# Patient Record
Sex: Male | Born: 1990 | Race: White | Hispanic: No | Marital: Single | Smoking: Never smoker
Health system: Southern US, Community
[De-identification: ages and names within clinical notes are randomized; demographics above are authoritative.]

---
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
## 2009-08-14 ENCOUNTER — Emergency Department (HOSPITAL_COMMUNITY): Admission: EM | Admit: 2009-08-14 | Discharge: 2009-08-14 | Payer: Self-pay | Admitting: Emergency Medicine

## 2010-01-21 ENCOUNTER — Emergency Department (HOSPITAL_COMMUNITY)
Admission: EM | Admit: 2010-01-21 | Discharge: 2010-01-21 | Payer: Self-pay | Source: Home / Self Care | Admitting: Emergency Medicine

## 2010-01-23 LAB — BASIC METABOLIC PANEL
BUN: 9 mg/dL (ref 6–23)
CO2: 29 mEq/L (ref 19–32)
Calcium: 10.3 mg/dL (ref 8.4–10.5)
Chloride: 101 mEq/L (ref 96–112)
Creatinine, Ser: 1.13 mg/dL (ref 0.4–1.5)
GFR calc Af Amer: >60 mL/min (ref >60)
GFR calc non Af Amer: >60 mL/min (ref >60)
Glucose, Bld: 99 mg/dL (ref 70–99)
Potassium: 4.1 mEq/L (ref 3.5–5.1)
Sodium: 141 mEq/L (ref 135–145)

## 2010-01-23 LAB — DIFFERENTIAL
Basophils Absolute: 0 10*3/uL (ref 0.0–0.1)
Basophils Relative: 0 % (ref 0–1)
Eosinophils Absolute: 0 10*3/uL (ref 0.0–0.7)
Eosinophils Relative: 0 % (ref 0–5)
Lymphocytes Relative: 9 % — ABNORMAL LOW (ref 12–46)
Lymphs Abs: 1.2 10*3/uL (ref 0.7–4.0)
Monocytes Absolute: 0.8 10*3/uL (ref 0.1–1.0)
Monocytes Relative: 6 % (ref 3–12)
Neutro Abs: 10.8 10*3/uL — ABNORMAL HIGH (ref 1.7–7.7)
Neutrophils Relative %: 85 % — ABNORMAL HIGH (ref 43–77)

## 2010-01-23 LAB — CBC
HCT: 46.9 % (ref 39.0–52.0)
Hemoglobin: 15.9 g/dL (ref 13.0–17.0)
MCH: 29.3 pg (ref 26.0–34.0)
MCHC: 33.9 g/dL (ref 30.0–36.0)
MCV: 86.4 fL (ref 78.0–100.0)
Platelets: 202 10*3/uL (ref 150–400)
RBC: 5.43 MIL/uL (ref 4.22–5.81)
RDW: 13 % (ref 11.5–15.5)
WBC: 12.7 10*3/uL — ABNORMAL HIGH (ref 4.0–10.5)

## 2010-01-23 LAB — ETHANOL: Alcohol, Ethyl (B): <5 mg/dL (ref 0–10)

## 2010-01-23 LAB — RAPID URINE DRUG SCREEN, HOSP PERFORMED
Amphetamines: NOT DETECTED
Barbiturates: NOT DETECTED
Benzodiazepines: NOT DETECTED
Cocaine: NOT DETECTED
Opiates: NOT DETECTED
Tetrahydrocannabinol: NOT DETECTED

## 2010-01-30 ENCOUNTER — Emergency Department (HOSPITAL_COMMUNITY)
Admission: EM | Admit: 2010-01-30 | Discharge: 2010-01-30 | Payer: Self-pay | Source: Home / Self Care | Admitting: Emergency Medicine

## 2010-01-31 ENCOUNTER — Inpatient Hospital Stay (HOSPITAL_COMMUNITY)
Admission: AD | Admit: 2010-01-31 | Discharge: 2010-02-07 | Disposition: A | Discharge status: Home / Self Care | Payer: Self-pay | Attending: Psychiatry | Admitting: Psychiatry

## 2010-01-31 LAB — BASIC METABOLIC PANEL
BUN: 11 mg/dL (ref 6–23)
CO2: 30 mEq/L (ref 19–32)
Calcium: 9.9 mg/dL (ref 8.4–10.5)
Chloride: 99 mEq/L (ref 96–112)
Creatinine, Ser: 1.17 mg/dL (ref 0.4–1.5)
GFR calc Af Amer: >60 mL/min (ref >60)
GFR calc non Af Amer: >60 mL/min (ref >60)
Glucose, Bld: 91 mg/dL (ref 70–99)
Potassium: 3.4 mEq/L — ABNORMAL LOW (ref 3.5–5.1)
Sodium: 140 mEq/L (ref 135–145)

## 2010-01-31 LAB — DIFFERENTIAL
Basophils Absolute: 0 10*3/uL (ref 0.0–0.1)
Basophils Relative: 0 % (ref 0–1)
Eosinophils Absolute: 0.1 10*3/uL (ref 0.0–0.7)
Eosinophils Relative: 0 % (ref 0–5)
Lymphocytes Relative: 9 % — ABNORMAL LOW (ref 12–46)
Lymphs Abs: 1.3 10*3/uL (ref 0.7–4.0)
Monocytes Absolute: 1.1 10*3/uL — ABNORMAL HIGH (ref 0.1–1.0)
Monocytes Relative: 8 % (ref 3–12)
Neutro Abs: 11.8 10*3/uL — ABNORMAL HIGH (ref 1.7–7.7)
Neutrophils Relative %: 82 % — ABNORMAL HIGH (ref 43–77)

## 2010-01-31 LAB — URINALYSIS, ROUTINE W REFLEX MICROSCOPIC
Bilirubin Urine: NEGATIVE
Hgb urine dipstick: NEGATIVE
Ketones, ur: NEGATIVE mg/dL
Nitrite: NEGATIVE
Protein, ur: NEGATIVE mg/dL
Specific Gravity, Urine: >1.03 — ABNORMAL HIGH (ref 1.005–1.030)
Urine Glucose, Fasting: NEGATIVE mg/dL
Urobilinogen, UA: 0.2 mg/dL (ref 0.0–1.0)
pH: 5.5 (ref 5.0–8.0)

## 2010-01-31 LAB — RAPID URINE DRUG SCREEN, HOSP PERFORMED
Amphetamines: NOT DETECTED
Barbiturates: NOT DETECTED
Benzodiazepines: NOT DETECTED
Cocaine: NOT DETECTED
Opiates: NOT DETECTED
Tetrahydrocannabinol: NOT DETECTED

## 2010-01-31 LAB — CBC
HCT: 43.1 % (ref 39.0–52.0)
Hemoglobin: 15.3 g/dL (ref 13.0–17.0)
MCH: 30.9 pg (ref 26.0–34.0)
MCHC: 35.5 g/dL (ref 30.0–36.0)
MCV: 87.1 fL (ref 78.0–100.0)
Platelets: 199 10*3/uL (ref 150–400)
RBC: 4.95 MIL/uL (ref 4.22–5.81)
RDW: 12.9 % (ref 11.5–15.5)
WBC: 14.4 10*3/uL — ABNORMAL HIGH (ref 4.0–10.5)

## 2010-01-31 LAB — ETHANOL: Alcohol, Ethyl (B): <5 mg/dL (ref 0–10)

## 2010-02-07 NOTE — H&P (Signed)
NAME:  Juan Castro, Juan Castro NO.:  [id number]  MEDICAL RECORD NO.:  [id number]          PATIENT TYPE:  IPS  LOCATION:  0507                          FACILITY:  [facility]  PHYSICIAN:  Marlis Edelson, DO        DATE OF BIRTH:  04/21/1990  DATE OF ADMISSION:  01/31/2010 DATE OF DISCHARGE:                      PSYCHIATRIC ADMISSION ASSESSMENT   This is a 20 year old single male, involuntary petitioned on January 31, 2010.  HISTORY OF PRESENT ILLNESS:  The patient is here on petition with papers stating that the patient has thoughts of suicide, had tied a noose in the woods and left a suicide note for his grandmother.  The patient does state that this is true.  He did write a suicide letter, left it on the counter for her to find, writing in the note that he would be behind the house.  They had seen a noose and a knife.  The patient was not nearby. He states that he was nervous when he had seen the noose and was concerned that he might actually harm himself and started walking, stating that he was "getting to that point."  Grandmother, again, had called the police, and the patient was taken to the emergency room for further assessment.  The patient reports a history of, depression having problems with his girlfriend.  He has significant legal issues.  He states he currently has warrants out for destroying property, trespassing, currently on probation for assault and stalking and communicating threats.  He denies any homicidality ideation towards her. He states he would never harm his girlfriend.  His sleep and appetite have been satisfactory.  He denies any substance use.  The patient also has a charge related to stealing his grandfather's gun that he states he had taken to his mother's house for her protection, and the patient states that his grandfather intends to drop the charge.  PAST PSYCHIATRIC HISTORY:  First admission to the [facility].  Was hospitalized in  [place] on April 2011 for depression and a history of suicide attempt which he would not elaborate on.  He states he was prescribed an antidepressant but never took the medication and is unclear what it was.  SOCIAL HISTORY:  A 20 year old single male who lives with his grandmother.  He lives in [place].  He obtained his GED.  Currently unemployed.  He has no children.  FAMILY HISTORY:  None.  ALCOHOL AND DRUG HISTORY:  He denies any alcohol substance use.  PRIMARY CARE PROVIDER:  None.  MEDICAL PROBLEMS:  The patient considers himself healthy.  MEDICATIONS:  None.  DRUG ALLERGIES:  None.  PHYSICAL EXAMINATION:  Physical exam was done at [facility]. This is a young, normally-developed male.  He appears well nourished, well developed.  He offers no physical complaints.  LABORATORY STUDIES:  His WBC count is elevated 14.4.  Urine drug screen is negative.  Urinalysis is negative.  Potassium of 3.4.  Alcohol level less than 5.  MENTAL STATUS EXAM:  The patient is fully alert and cooperative, casually dressed, fair eye contact.  Speech is clear.  Normal pace and tone.  Mood is depressed.  The patient does appear flat.  Thought processes are coherent, goal directed.  Does not appear to be actively responding to any internal stimuli.  Promises safety.  Anxious about leaving.  Cognitive function intact.  His memory appears intact. Judgment and insight are partial.  DIAGNOSES:  AXIS I:  Mood disorder, not otherwise specified. AXIS II:  Deferred. AXIS III:  No known medical conditions. AXIS IV:  Problems with primary support group, problems related to legal system, and other psychosocial problems. AXIS V:  Current is 30.  PLAN:  We will assess further comorbidities.  The patient may benefit from an antidepressant or mood stabilizer.  We will continue to assess his mood and affect.  Contact the family for concerns and safety issues. His tentative length of stay at  this time is 3 to 4 days.     Landry Corporal, N.P.   ______________________________ Marlis Edelson, DO    JO/MEDQ  D:  02/01/2010  T:  02/01/2010  Job:  [id number]  Electronically Signed by Limmie PatriciaP. on 02/06/2010 03:58:48 PM Electronically Signed by Marlis Edelson MD on 02/07/2010 07:58:01 PM

## 2010-02-07 NOTE — Discharge Summary (Signed)
NAME:  Juan Castro NO.:  [id number]  MEDICAL RECORD NO.:  [id number]          PATIENT TYPE:  IPS  LOCATION:  0507                          FACILITY:  [facility]  PHYSICIAN:  Juan Edelson, DO        DATE OF BIRTH:  [birth year]  DATE OF ADMISSION:  01/31/2010 DATE OF DISCHARGE:  02/07/2010                              DISCHARGE SUMMARY   CHIEF COMPLAINT:  Involuntary admission for suicidal ideation.  HISTORY OF CHIEF COMPLAINT:  Juan Castro is a 20 year old Caucasian male who was admitted to [facility] on January 31, 2010, via involuntary petition.  He was here on papers because of thoughts of suicide.  He had tied a noose in the woods near his home and left a suicidal note for his grandmother.  He relates that the precipitating cause for this was a relationship issue in which he broke up with a girlfriend.  The patient stated that he did write the note and he did tie the noose.  He later did not think about committing suicide, but began to walk away and was walking around thinking when he was picked up.  Fortunately his grandmother had called the police and the patient was taken to the emergency department for further assessment. He reports a history of depression and having problems with his girlfriend.  He has also had a history of significant legal issues and he currently has warrants out for destroying property, trespassing, and has been on probation for assault, stalking, and communicating threats. Again, he states that this all centers around activities, including the legal activities that stem from his problems with his girlfriend.  He denied any homicidal ideation towards her, however.  He stated he would never want to hurt her.  His sleep and appetite have been satisfactory. He denied any significant substance abuse.  He also stated that he had a charge related to stealing his grandfather's gun and that he had taken it from his mother's  house for protection.  He stated that his grandfather was going to drop the charge, which would help free him from that issue.  PAST PSYCHIATRIC HISTORY:  This was his first admission to [facility].  He had been hospitalized to [facility] in April of 2011 for depression and a history of suicidal attempt.  He states that he was prescribed an antidepressant, but never took the medication and he was uncertain as to what that medication was.  HOSPITAL COURSE:  During his course of hospitalization, Juan Castro was integrated into the adult unit.  Communications were kept open with his mother and family, particularly on suicide prevention and crisis intervention.  We also talked with them about removing weapons from the home prior to his discharge.  I had the opportunity to speak with his mother by telephone.  She felt that he was doing much better and that he was safe for discharge.  He would be living with his mother after his discharge.  It was also important to note that his mother has had a history of depression.  There has been a family history of depression. She has responded well  to Zoloft in the past and he was started on Zoloft at 50 mg daily.  He initially had some diaphoresis with the medicine and a bit of GI upset, but after several days the GI symptoms resolved, he had no further diaphoresis, no dry mouth, no headache, or other problems with the Zoloft.  He developed no suicidal ideation on the drug and in fact, his suicidal ideation resolved during his course of treatment.  Although initially reserved, he became more pleasant, cooperative, and engaging during the course of treatment.  By the time of discharge, he stated that he felt much better.  He was engaging well with peers and was also engaging with staff.  He was far more appropriate.  He interacted well on the unit.  He displayed no behavioral discord and manifested no evidence of hypomania, mania,  or psychosis.  He had no racing thoughts or hyperpsychomotor activity or agitation.  He related by the time of discharge that he had a positive safety plan.  He understood a lot of his recent problems were his issues over his girlfriend.  He had come to some resolutions about that and stated that "I need to remove myself in from that situation because of the difficulty that is causing me."  He understood that the problems of anger, aggression, and the suicidal thoughts stemmed from this relationship which has been unhealthy for him and he has intent to move on from that relationship.  His family also felt that his suicidal threats and recent activities have stemmed from this relationship issues.  Urine drug screen at admission was unremarkable.  He had had no history of using antidepressant medications, but he was willing to try the medication.  He did have a family history as related above of depression, with his mother responding well to Zoloft.  He had a paternal cousin who suffered from depression and suicide.  He stated that sometimes he does isolate himself when he feels poorly.  He had increased knowledge and insight about depressive symptoms during his course of care.  He remained active in the milieu.  By February 05, 2010, he was stating that all was good for him.  He was sleeping well, was eating better.  He was reporting no problems.  He was attending all groups and was making good progress.  He was engaging well with peers and staff.  His eye contact was good.  His speech was clear.  He remained alert and oriented.  One medical issue during the course of his hospitalization was an elevated blood pressure 145/99 at the time of admission.  Further blood pressure showed some elevations.  He did not interact at this point with treatment for antidepressants, but I have recommended that he take his blood pressure at home and at a local clinic, the [facility].  He is  also to follow up with primary care.  There has a family history of hypertension, with his father and grandparents suffering from elevated blood pressures.  His highest blood pressure noted during the course of hospitalization was 152/105 and that was early morning on the date of discharge.  LABORATORY AND IMAGING:  None.  CONSULTATIONS:  None.  COMPLICATIONS:  None.  PROCEDURES:  None.  MENTAL STATUS EXAMINATION ON DISCHARGE:  He was casually dressed.  He was pleasant, cooperative, established good eye contact.  His motor function was normal.  His speech was clear, coherent.  Regular rate, rhythm, volume, and tone.  His level of conscious was  alert.  His mood was good.  He related that he was ready to go home.  He wanted to go home and do something fun after having been cooped up in the hospital for the past week.  He stated "my mood is hyper."  I am anxious about getting home.  He did not display hypomania or mania and did not relate any racing thoughts.  He simply said that he wanted to be active because of having been in the hospital for this period of time.  His anxiety level was diminished.  Thought process was linear, logical, and goal- directed.  Thought content without perceptual symptoms, ideas of reference, or delusions.  He relates no paranoia.  When asked about suicide, he states "no, no, no suicidal thoughts."  "I have too much to live for" and "I am too young."  No homicidal ideation.  Judgment had improved.  Insight had improved.  He was cognitively intact.  DISCHARGE DIAGNOSES:  Axis I:  Mood disorder, not otherwise specified. Axis II:  Deferred. Axis III:  Elevated blood pressures. Axis IV:  Problems with legal system, recent relationship discord, supportive mother. Axis V: 55.  CONDITION ON DISCHARGE:  Improved with resolution of suicidal ideation. He is now future directed.  He has no hypomania, no mania, no psychosis.  DISCHARGE INSTRUCTIONS:  He is to  taking his blood pressures at home with the machine that he has and also through the [facility].  I have recommended followup, given the family history of elevated blood pressures and the elevated blood pressures noted during the course of his hospitalization.  DISCHARGE MEDICATIONS:  Zoloft 50 mg p.o. daily for depression.  [facility] Wednesday, February 08, 2010, at 8 a.m. in [place], [place].  SPECIAL INSTRUCTIONS:  Return to the hospital for any adverse reactions to medications, marked change in mood and affect, or development of suicidal or homicidal ideation.  Prognosis is fair with appropriate psychiatric followup and compliance.          ______________________________ Juan Edelson, DO     DB/MEDQ  D:  02/07/2010  T:  02/07/2010  Job:  [id number]  Electronically Signed by Juan Edelson MD on 02/07/2010 07:58:07 PM

## 2010-03-24 LAB — URINALYSIS, ROUTINE W REFLEX MICROSCOPIC
Bilirubin Urine: NEGATIVE
Glucose, UA: NEGATIVE mg/dL
Hgb urine dipstick: NEGATIVE
Ketones, ur: NEGATIVE mg/dL
Nitrite: NEGATIVE
Protein, ur: NEGATIVE mg/dL
Specific Gravity, Urine: 1.025 (ref 1.005–1.030)
Urobilinogen, UA: 0.2 mg/dL (ref 0.0–1.0)
pH: 6 (ref 5.0–8.0)

## 2011-01-03 ENCOUNTER — Emergency Department (HOSPITAL_COMMUNITY): Admission: EM | Admit: 2011-01-03 | Discharge: 2011-01-03 | Discharge status: Against Medical Advice | Payer: Self-pay

## 2011-01-03 NOTE — ED Notes (Signed)
No answer when called 

## 2011-07-22 ENCOUNTER — Encounter (HOSPITAL_COMMUNITY): Payer: Self-pay

## 2011-07-22 ENCOUNTER — Emergency Department (HOSPITAL_COMMUNITY): Payer: Self-pay

## 2011-07-22 ENCOUNTER — Emergency Department (HOSPITAL_COMMUNITY)
Admission: EM | Admit: 2011-07-22 | Discharge: 2011-07-22 | Disposition: A | Discharge status: Home / Self Care | Payer: Self-pay | Attending: Emergency Medicine | Admitting: Emergency Medicine

## 2011-07-22 DIAGNOSIS — S61409A Unspecified open wound of unspecified hand, initial encounter: Secondary | ICD-10-CM | POA: Insufficient documentation

## 2011-07-22 DIAGNOSIS — S61412A Laceration without foreign body of left hand, initial encounter: Secondary | ICD-10-CM

## 2011-07-22 DIAGNOSIS — W298XXA Contact with other powered powered hand tools and household machinery, initial encounter: Secondary | ICD-10-CM | POA: Insufficient documentation

## 2011-07-22 DIAGNOSIS — M79609 Pain in unspecified limb: Secondary | ICD-10-CM | POA: Insufficient documentation

## 2011-07-22 MED ORDER — LIDOCAINE HCL (PF) 2 % IJ SOLN: 10.0000 mL | Freq: Once | INTRAMUSCULAR | Status: DC

## 2011-07-22 MED ORDER — TETANUS-DIPHTH-ACELL PERTUSSIS 5-2.5-18.5 LF-MCG/0.5 IM SUSP
0.5000 mL | Freq: Once | INTRAMUSCULAR | Status: AC
  Administered 2011-07-22: 0.5 mL via INTRAMUSCULAR
  Filled 2011-07-22: qty 0.5

## 2011-07-22 MED ORDER — LIDOCAINE HCL (PF) 2 % IJ SOLN
INTRAMUSCULAR | Status: DC
Start: 2011-07-22 — End: 2011-07-22
  Filled 2011-07-22: qty 10

## 2011-07-22 NOTE — ED Notes (Signed)
Was using metal grinder and cut left hand between 4th anf 5th finger

## 2011-07-22 NOTE — ED Notes (Signed)
Patient with no complaints at this time. Respirations even and unlabored. Skin warm/dry. Discharge instructions reviewed with patient at this time. Patient given opportunity to voice concerns/ask questions. Patient discharged at this time and left Emergency Department with steady gait.   

## 2011-07-22 NOTE — ED Provider Notes (Signed)
History     CSN: [id number]  Arrival date & time 07/22/11  1612   First MD Initiated Contact with Patient 07/22/11 1631      Chief Complaint  Patient presents with  . Laceration    (Consider location/radiation/quality/duration/timing/severity/associated sxs/prior treatment) Patient is a 21 y.o. male presenting with skin laceration. The history is provided by the patient.  Laceration  The incident occurred 3 to 5 hours ago. The laceration is located on the left hand. The laceration is 2 cm in size. The laceration mechanism was a a metal edge (lacaeration while working with a grinder.). The pain is moderate. The pain has been constant since onset. It is unknown if a foreign body is present. His tetanus status is out of date.    History reviewed. No pertinent past medical history.  History reviewed. No pertinent past surgical history.  No family history on file.  History  Substance Use Topics  . Smoking status: Never Smoker   . Smokeless tobacco: Not on file  . Alcohol Use: Yes     occ      Review of Systems  Constitutional: Negative for activity change.       All ROS Neg except as noted in HPI  HENT: Negative for nosebleeds and neck pain.   Eyes: Negative for photophobia and discharge.  Respiratory: Negative for cough, shortness of breath and wheezing.   Cardiovascular: Negative for chest pain and palpitations.  Gastrointestinal: Negative for abdominal pain and blood in stool.  Genitourinary: Negative for dysuria, frequency and hematuria.  Musculoskeletal: Negative for back pain and arthralgias.  Skin: Negative.   Neurological: Negative for dizziness, seizures and speech difficulty.  Psychiatric/Behavioral: Negative for hallucinations and confusion.    Allergies  Review of patient's allergies indicates no known allergies.  Home Medications  No current outpatient prescriptions on file.  BP 154/98  Pulse 109  Temp 98.7 F (37.1 C) (Oral)  Resp 20  Ht 6\' 4"   (1.93 m)  Wt 240 lb (108.863 kg)  BMI 29.21 kg/m2  SpO2 100%  Physical Exam  Nursing note and vitals reviewed. Constitutional: He is oriented to person, place, and time. He appears well-developed and well-nourished.  Non-toxic appearance.  HENT:  Head: Normocephalic.  Right Ear: Tympanic membrane and external ear normal.  Left Ear: Tympanic membrane and external ear normal.  Eyes: EOM and lids are normal. Pupils are equal, round, and reactive to light.  Neck: Normal range of motion. Neck supple. Carotid bruit is not present.  Cardiovascular: Normal rate, regular rhythm, normal heart sounds, intact distal pulses and normal pulses.   Pulmonary/Chest: Breath sounds normal. No respiratory distress.  Abdominal: Soft. Bowel sounds are normal. There is no tenderness. There is no guarding.  Musculoskeletal: Normal range of motion.       Laceration of the web space between the 4th and 5th  Fingers of the left hand. FROM of the fingers. Sensory and motor function wnl. No bone or tendon involvement.  Lymphadenopathy:       Head (right side): No submandibular adenopathy present.       Head (left side): No submandibular adenopathy present.    He has no cervical adenopathy.  Neurological: He is alert and oriented to person, place, and time. He has normal strength. No cranial nerve deficit or sensory deficit.  Skin: Skin is warm and dry.  Psychiatric: He has a normal mood and affect. His speech is normal.    ED Course  Procedures: LACERATION REPAIR - patient  identified by arm band. Permission for the procedure given by the patient. Time out taken before laceration repair of the left pain. The wound was painted with Betadine. It was infiltrated with 2% plain lidocaine. The wound was scrubbed with a Betadine scrub brush and irrigated with saline. The wound was then inspected there was no bone or tendon or capsule involvement. The wound was repaired with 6 interrupted sutures of 4-0 Prolene. The wound  measures 2.3 cm.  After the procedure there is good capillary refill and good movement of the fingers. Sterile dressing was applied. The patient tolerated the procedure without any problem or complication.  Labs Reviewed - No data to display No results found.   No diagnosis found.    MDM  Patient sustained a laceration to the left hand while working with a metal grinder. X-ray of the hand is negative for fracture or foreign body. The wound to the hand was repaired with 6 sutures. Patient to have the sutures removed in 7 days. Patient advised to return sooner if any signs of infection. Patient's tetanus status was updated today.        Kathie Dike, [place] 07/22/11 1740

## 2011-07-23 NOTE — ED Provider Notes (Signed)
Medical screening examination/treatment/procedure(s) were performed by non-physician practitioner and as supervising physician I was immediately available for consultation/collaboration. Devoria Albe, MD, FACEP   Ward Givens, MD 07/23/11 [phone]

## 2013-09-23 IMAGING — CR DG HAND COMPLETE 3+V*L*
4 series · 4 of 4 positions shown · non-contrast
Comparison: 07/16/2004 radiographs.

CLINICAL DATA: Laceration between the fourth and fifth fingers.

LEFT HAND - COMPLETE 3+ VIEW

[view not recorded (1 of 4)]
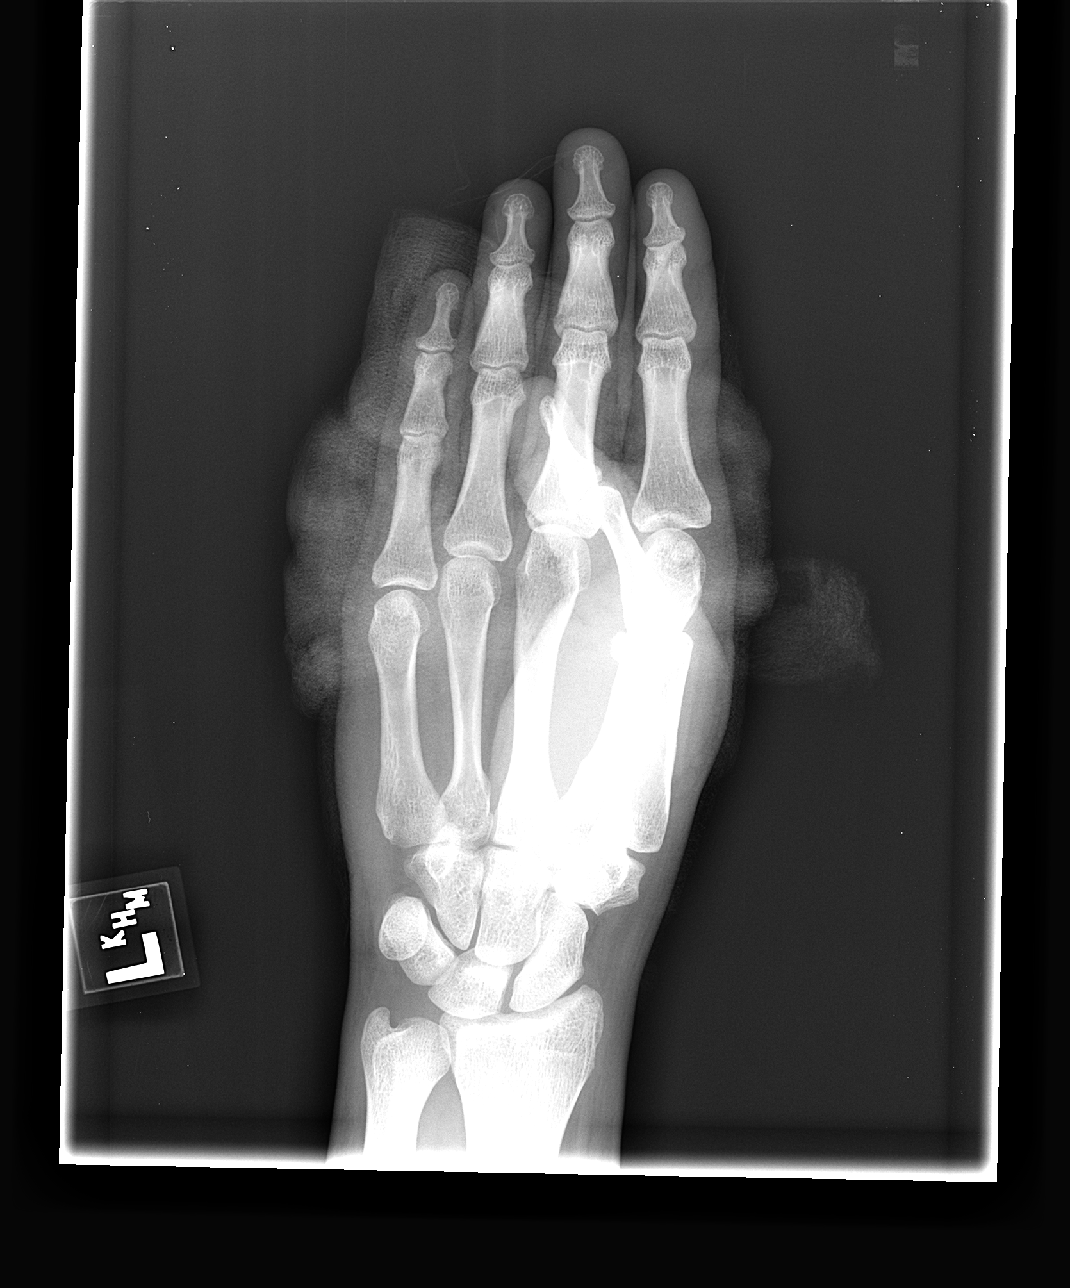

[view not recorded (2 of 4)]
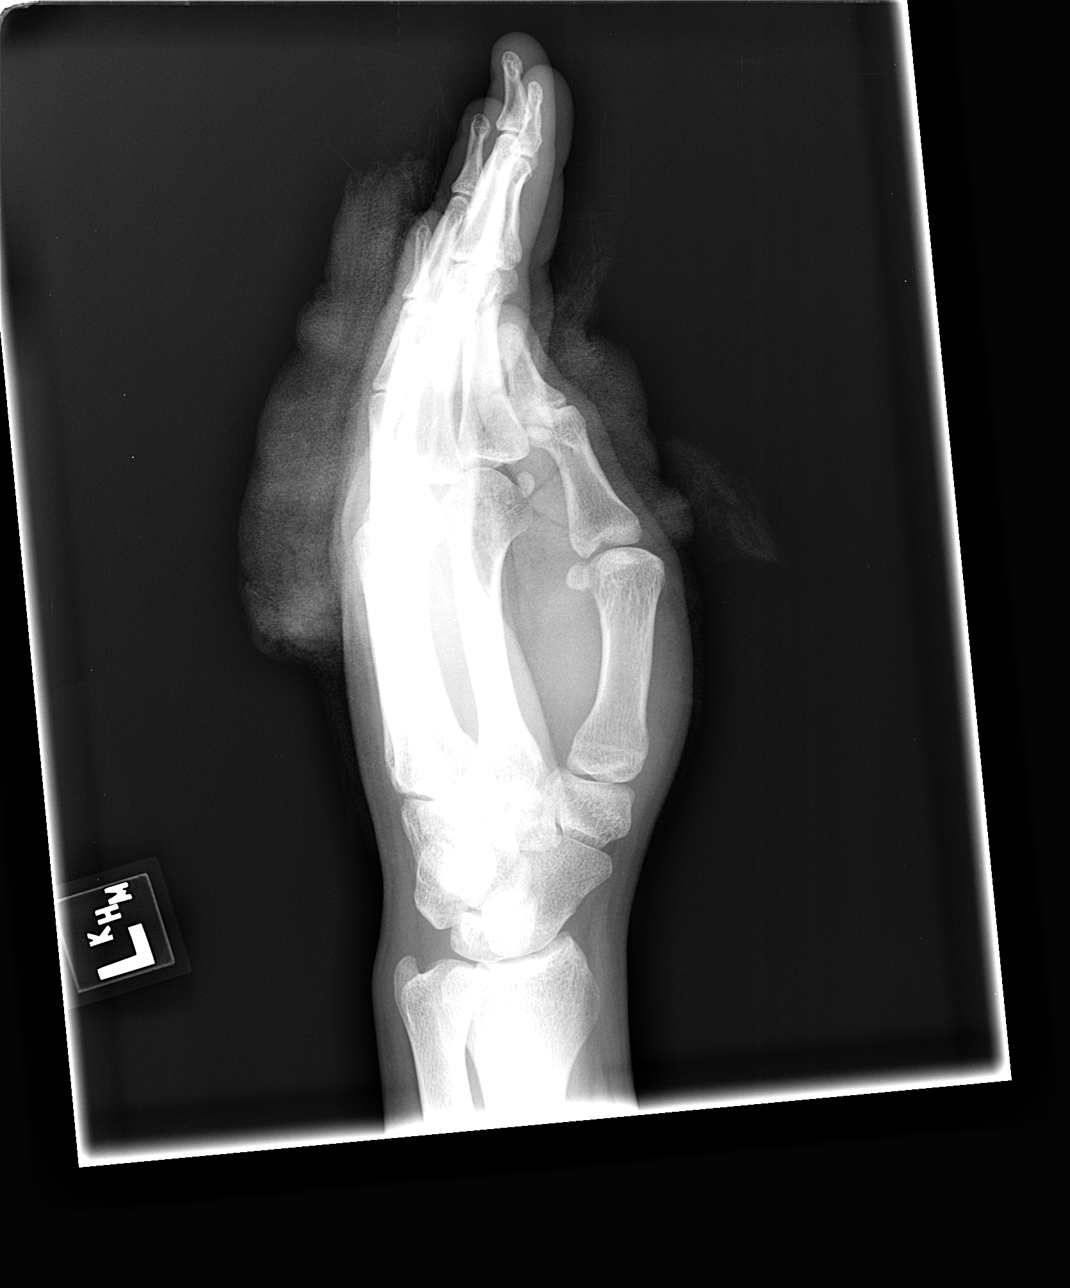

[view not recorded (3 of 4)]
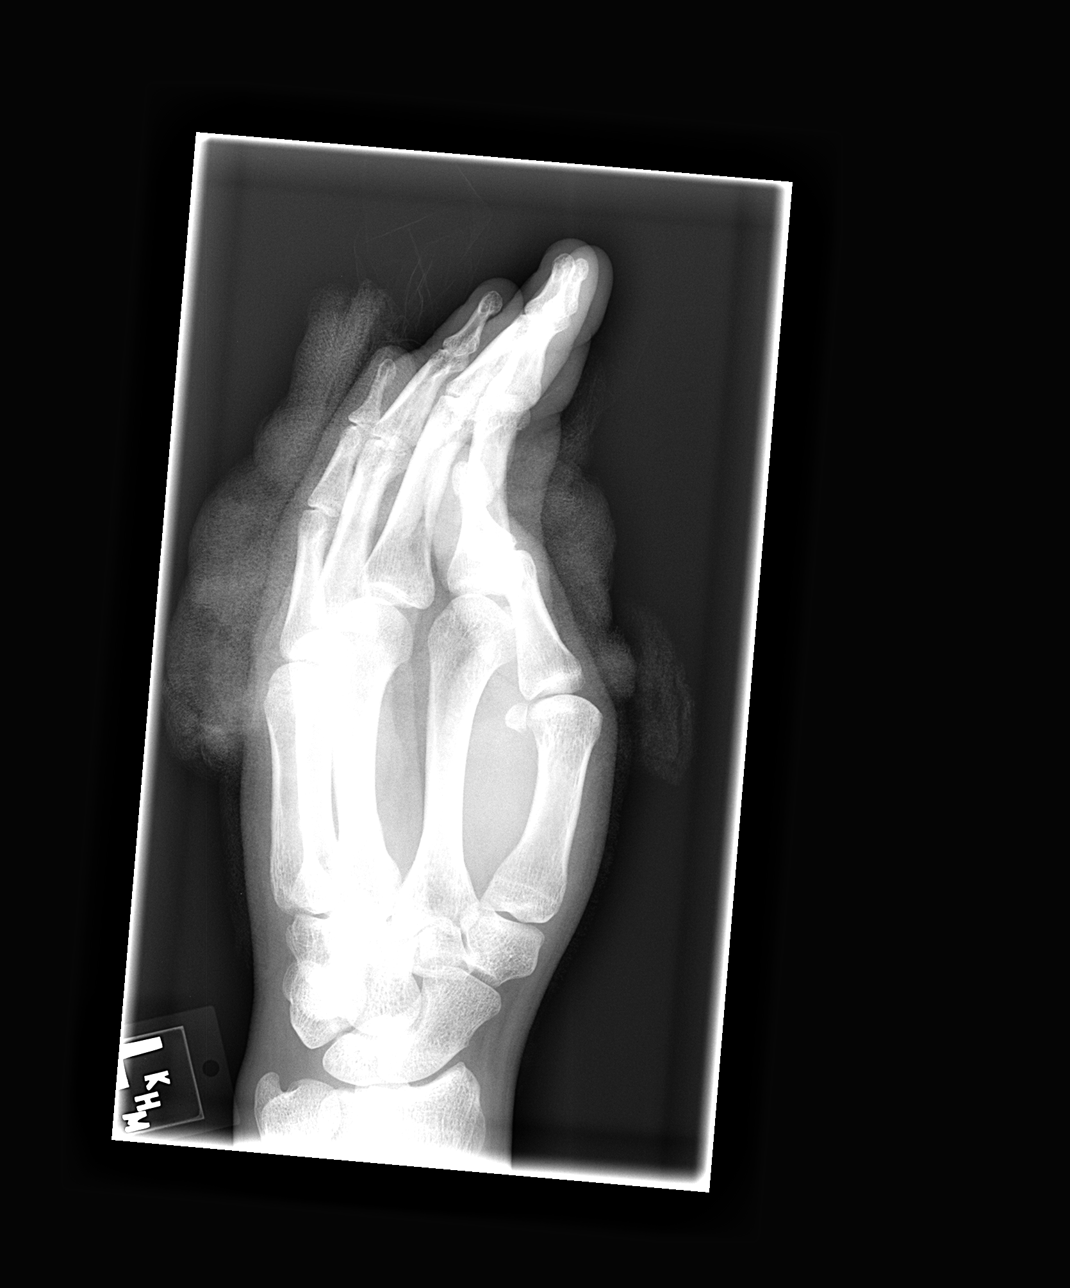

[view not recorded (4 of 4)]
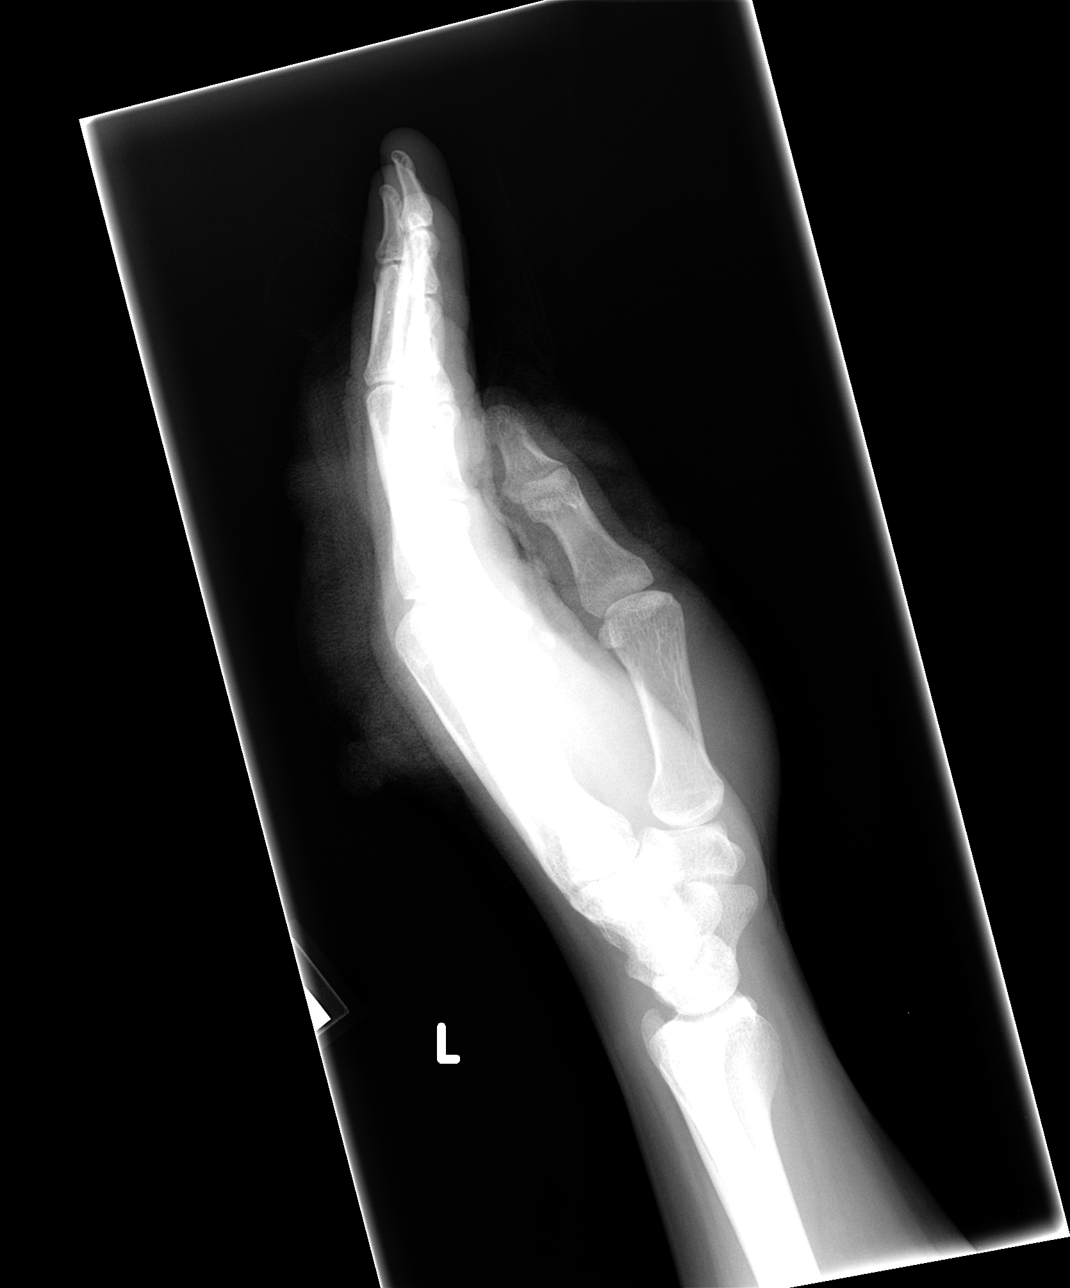

[4 of 4 positions shown; findings below may reference images not displayed]

FINDINGS: Overlying bandages limit soft tissue evaluation.  No
foreign body or definite soft tissue emphysema is seen.  There is
no evidence of acute fracture or dislocation.
IMPRESSION: No acute osseous findings or evidence of foreign body.

## 2018-09-13 ENCOUNTER — Encounter (HOSPITAL_COMMUNITY): Payer: Self-pay | Admitting: Emergency Medicine

## 2018-09-13 ENCOUNTER — Other Ambulatory Visit: Payer: Self-pay

## 2018-09-13 ENCOUNTER — Emergency Department (HOSPITAL_COMMUNITY)
Admission: EM | Admit: 2018-09-13 | Discharge: 2018-09-13 | Disposition: A | Discharge status: Home / Self Care | Payer: No Typology Code available for payment source | Attending: Emergency Medicine | Admitting: Emergency Medicine

## 2018-09-13 DIAGNOSIS — S50812A Abrasion of left forearm, initial encounter: Secondary | ICD-10-CM | POA: Diagnosis not present

## 2018-09-13 DIAGNOSIS — Z23 Encounter for immunization: Secondary | ICD-10-CM | POA: Diagnosis not present

## 2018-09-13 DIAGNOSIS — S0031XA Abrasion of nose, initial encounter: Secondary | ICD-10-CM | POA: Diagnosis not present

## 2018-09-13 DIAGNOSIS — Y93I9 Activity, other involving external motion: Secondary | ICD-10-CM | POA: Diagnosis not present

## 2018-09-13 DIAGNOSIS — S50811A Abrasion of right forearm, initial encounter: Secondary | ICD-10-CM | POA: Insufficient documentation

## 2018-09-13 DIAGNOSIS — S60511A Abrasion of right hand, initial encounter: Secondary | ICD-10-CM | POA: Diagnosis not present

## 2018-09-13 DIAGNOSIS — S60512A Abrasion of left hand, initial encounter: Secondary | ICD-10-CM | POA: Insufficient documentation

## 2018-09-13 DIAGNOSIS — Y92410 Unspecified street and highway as the place of occurrence of the external cause: Secondary | ICD-10-CM | POA: Diagnosis not present

## 2018-09-13 DIAGNOSIS — Y999 Unspecified external cause status: Secondary | ICD-10-CM | POA: Diagnosis not present

## 2018-09-13 DIAGNOSIS — T07XXXA Unspecified multiple injuries, initial encounter: Secondary | ICD-10-CM

## 2018-09-13 DIAGNOSIS — S0990XA Unspecified injury of head, initial encounter: Secondary | ICD-10-CM | POA: Diagnosis present

## 2018-09-13 MED ORDER — OXYCODONE-ACETAMINOPHEN 5-325 MG PO TABS
2.0000 | ORAL_TABLET | Freq: Once | ORAL | Status: AC
  Administered 2018-09-13: 22:00:00 2 via ORAL
  Filled 2018-09-13: qty 2

## 2018-09-13 MED ORDER — BACITRACIN ZINC 500 UNIT/GM EX OINT
TOPICAL_OINTMENT | Freq: Once | CUTANEOUS | Status: DC
  Filled 2018-09-13: qty 9

## 2018-09-13 MED ORDER — IBUPROFEN 800 MG PO TABS
800.0000 mg | ORAL_TABLET | Freq: Once | ORAL | Status: AC
  Administered 2018-09-13: 800 mg via ORAL
  Filled 2018-09-13: qty 1

## 2018-09-13 MED ORDER — OXYCODONE-ACETAMINOPHEN 5-325 MG PO TABS: 1.0000 | ORAL_TABLET | ORAL | 0 refills | Status: AC | PRN

## 2018-09-13 MED ORDER — TETANUS-DIPHTH-ACELL PERTUSSIS 5-2.5-18.5 LF-MCG/0.5 IM SUSP
0.5000 mL | Freq: Once | INTRAMUSCULAR | Status: AC
  Administered 2018-09-13: 0.5 mL via INTRAMUSCULAR
  Filled 2018-09-13: qty 0.5

## 2018-09-13 MED ORDER — BACITRACIN-NEOMYCIN-POLYMYXIN 400-5-5000 EX OINT
TOPICAL_OINTMENT | Freq: Once | CUTANEOUS | Status: AC
  Administered 2018-09-13: 23:00:00 via TOPICAL

## 2018-09-13 MED ORDER — IBUPROFEN 800 MG PO TABS: 800.0000 mg | ORAL_TABLET | Freq: Three times a day (TID) | ORAL | 0 refills | Status: AC
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
## 2018-09-13 NOTE — Discharge Instructions (Addendum)
Return if any problems.  Follow up at Urgent care in 2 days for wound check
